# Patient Record
Sex: Female | Born: 2000
Health system: Southern US, Community
[De-identification: ages and names within clinical notes are randomized; demographics above are authoritative.]

## PROBLEM LIST (undated history)

## (undated) ENCOUNTER — Inpatient Hospital Stay: Payer: Self-pay

## (undated) DIAGNOSIS — J45909 Unspecified asthma, uncomplicated: Secondary | ICD-10-CM

## (undated) HISTORY — PX: NO PAST SURGERIES: SHX2092

---
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
## 2020-01-17 DIAGNOSIS — Z113 Encounter for screening for infections with a predominantly sexual mode of transmission: Secondary | ICD-10-CM | POA: Diagnosis not present

## 2020-01-17 DIAGNOSIS — N760 Acute vaginitis: Secondary | ICD-10-CM | POA: Diagnosis not present

## 2020-04-25 DIAGNOSIS — B373 Candidiasis of vulva and vagina: Secondary | ICD-10-CM | POA: Diagnosis not present

## 2020-04-25 DIAGNOSIS — N760 Acute vaginitis: Secondary | ICD-10-CM | POA: Diagnosis not present

## 2020-04-25 DIAGNOSIS — Z113 Encounter for screening for infections with a predominantly sexual mode of transmission: Secondary | ICD-10-CM | POA: Diagnosis not present

## 2020-07-22 DIAGNOSIS — N760 Acute vaginitis: Secondary | ICD-10-CM | POA: Diagnosis not present

## 2020-07-22 DIAGNOSIS — Z113 Encounter for screening for infections with a predominantly sexual mode of transmission: Secondary | ICD-10-CM | POA: Diagnosis not present

## 2020-09-23 DIAGNOSIS — N760 Acute vaginitis: Secondary | ICD-10-CM | POA: Diagnosis not present

## 2021-02-04 DIAGNOSIS — Z114 Encounter for screening for human immunodeficiency virus [HIV]: Secondary | ICD-10-CM | POA: Diagnosis not present

## 2021-02-04 DIAGNOSIS — Z113 Encounter for screening for infections with a predominantly sexual mode of transmission: Secondary | ICD-10-CM | POA: Diagnosis not present

## 2021-02-04 DIAGNOSIS — B3731 Acute candidiasis of vulva and vagina: Secondary | ICD-10-CM | POA: Diagnosis not present

## 2021-09-10 DIAGNOSIS — Z113 Encounter for screening for infections with a predominantly sexual mode of transmission: Secondary | ICD-10-CM | POA: Diagnosis not present

## 2022-02-13 DIAGNOSIS — N898 Other specified noninflammatory disorders of vagina: Secondary | ICD-10-CM | POA: Diagnosis not present

## 2022-07-01 DIAGNOSIS — Z113 Encounter for screening for infections with a predominantly sexual mode of transmission: Secondary | ICD-10-CM | POA: Diagnosis not present

## 2022-08-26 DIAGNOSIS — Z113 Encounter for screening for infections with a predominantly sexual mode of transmission: Secondary | ICD-10-CM | POA: Diagnosis not present

## 2022-08-26 DIAGNOSIS — N390 Urinary tract infection, site not specified: Secondary | ICD-10-CM | POA: Diagnosis not present

## 2022-08-26 DIAGNOSIS — Z114 Encounter for screening for human immunodeficiency virus [HIV]: Secondary | ICD-10-CM | POA: Diagnosis not present

## 2023-03-16 ENCOUNTER — Ambulatory Visit: Payer: Self-pay
